# Patient Record
Sex: Male | Born: 2000 | Race: White | Hispanic: No | Marital: Single | Smoking: Current some day smoker
Health system: Southern US, Community
[De-identification: ages and names within clinical notes are randomized; demographics above are authoritative.]

## PROBLEM LIST (undated history)

## (undated) DIAGNOSIS — F909 Attention-deficit hyperactivity disorder, unspecified type: Secondary | ICD-10-CM

## (undated) DIAGNOSIS — J45909 Unspecified asthma, uncomplicated: Secondary | ICD-10-CM

## (undated) HISTORY — DX: Unspecified asthma, uncomplicated: J45.909

## (undated) HISTORY — DX: Attention-deficit hyperactivity disorder, unspecified type: F90.9

---
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
## 2018-07-07 ENCOUNTER — Other Ambulatory Visit: Payer: Self-pay

## 2018-07-07 ENCOUNTER — Ambulatory Visit (HOSPITAL_COMMUNITY)
Admission: EM | Admit: 2018-07-07 | Discharge: 2018-07-07 | Disposition: A | Discharge status: Home / Self Care | Payer: BLUE CROSS/BLUE SHIELD | Attending: Internal Medicine | Admitting: Internal Medicine

## 2018-07-07 ENCOUNTER — Encounter (HOSPITAL_COMMUNITY): Payer: Self-pay

## 2018-07-07 DIAGNOSIS — K2901 Acute gastritis with bleeding: Secondary | ICD-10-CM | POA: Diagnosis not present

## 2018-07-07 MED ORDER — SUCRALFATE 1 G PO TABS: 1.0000 g | ORAL_TABLET | Freq: Three times a day (TID) | ORAL | 0 refills | Status: AC

## 2018-07-07 MED ORDER — SUCRALFATE 1 G PO TABS: 1.0000 g | ORAL_TABLET | Freq: Three times a day (TID) | ORAL | 0 refills | Status: DC

## 2018-07-07 MED ORDER — PANTOPRAZOLE SODIUM 20 MG PO TBEC: 20.0000 mg | DELAYED_RELEASE_TABLET | Freq: Two times a day (BID) | ORAL | 0 refills | Status: AC

## 2018-07-07 NOTE — ED Provider Notes (Signed)
[facility]    CSN: [id number] Arrival date & time: 07/07/18  1858     History   Chief Complaint Chief Complaint  Patient presents with  . Hematemesis    HPI Ronald Osborne is a 18 y.o. male with a history of ADHD comes to urgent care with complaints of 1 day history of mild to moderate epigastric pain with vomiting.  Patient was in his usual state of health when he started having epigastric pain yesterday.  He had one episode of emesis yesterday.  Emesis contained food previously eaten.  He then had another episode of emesis this morning which had some bright red blood.  At that time he had some dizziness but denies passing out.  He started tolerating oral intake throughout the day and has not had any further episodes of vomiting.  Epigastric pain is mild at this time.  He denies any radiation of the pain.  Nausea and vomiting has subsided.  No dizziness, lightheadedness or fainting.  Patient denies any alcohol intake or NSAID use.  He smokes marijuana regularly.   Past Medical History:  Diagnosis Date  . ADHD (attention deficit hyperactivity disorder)   . Asthma    No issues since he was a child    Patient Active Problem List   Diagnosis Date Noted  . ADHD (attention deficit hyperactivity disorder) 08/22/2014  . Wart 08/22/2014  . Anxiety state 08/22/2014    History reviewed. No pertinent surgical history.     Home Medications    Prior to Admission medications   Medication Sig Start Date End Date Taking? Authorizing Provider  amphetamine-dextroamphetamine (ADDERALL XR) 10 MG 24 hr capsule Take 1 capsule (10 mg total) by mouth daily. 04/29/15   Nani Ravens, MD  diphenhydrAMINE (BENADRYL) 25 MG tablet Take 1 tablet (25 mg total) by mouth every 8 (eight) hours as needed for itching. 08/09/17   Fayrene Helper, PA-C  pantoprazole (PROTONIX) 20 MG tablet Take 1 tablet (20 mg total) by mouth 2 (two) times daily for 14 days. 07/07/18 07/21/18  LampteyBritta Mccreedy, MD   sucralfate (CARAFATE) 1 g tablet Take 1 tablet (1 g total) by mouth 4 (four) times daily -  with meals and at bedtime for 14 days. 07/07/18 07/21/18  Merrilee Jansky, MD    Family History Family History  Problem Relation Age of Onset  . Cancer Mother   . Depression Mother   . ADD / ADHD Father   . Depression Father   . ADD / ADHD Brother   . Alcohol abuse Maternal Grandmother   . Cancer Maternal Grandmother   . Diabetes Maternal Grandmother   . Hypertension Maternal Grandmother   . Asthma Maternal Grandfather   . Cancer Maternal Grandfather   . Heart disease Maternal Grandfather   . Diabetes Maternal Grandfather   . Hyperlipidemia Maternal Grandfather   . Hypertension Maternal Grandfather   . Cancer Paternal Grandmother   . Diabetes Paternal Grandmother   . Hypertension Paternal Grandmother   . Cancer Paternal Grandfather   . Heart disease Paternal Grandfather   . Diabetes Paternal Grandfather   . Hyperlipidemia Paternal Grandfather   . Hypertension Paternal Grandfather     Social History Social History   Tobacco Use  . Smoking status: Current Some Day Smoker  . Smokeless tobacco: Never Used  Substance Use Topics  . Alcohol use: No    Alcohol/week: 0.0 standard drinks  . Drug use: Yes    Types: Marijuana  Allergies   Penicillins and Percocet [oxycodone-acetaminophen]   Review of Systems Review of Systems  Constitutional: Negative for activity change, appetite change, fatigue and fever.  HENT: Negative.   Respiratory: Negative for cough, chest tightness, shortness of breath and wheezing.   Cardiovascular: Negative for chest pain.  Gastrointestinal: Positive for abdominal pain, nausea and vomiting. Negative for abdominal distention, anal bleeding, blood in stool, constipation, diarrhea and rectal pain.  Genitourinary: Negative.   Musculoskeletal: Negative for arthralgias, back pain, gait problem and joint swelling.  Skin: Negative for pallor and rash.   Neurological: Negative for dizziness, syncope, light-headedness and headaches.  All other systems reviewed and are negative.    Physical Exam Triage Vital Signs ED Triage Vitals  Enc Vitals Group     BP 07/07/18 1926 133/63     Pulse Rate 07/07/18 1926 81     Resp 07/07/18 1926 17     Temp 07/07/18 1926 98.4 F (36.9 C)     Temp Source 07/07/18 1926 Oral     SpO2 07/07/18 1926 100 %     Weight --      Height --      Head Circumference --      Peak Flow --      Pain Score 07/07/18 1923 0     Pain Loc --      Pain Edu? --      Excl. in GC? --    No data found.  Updated Vital Signs BP 133/63 (BP Location: Right Arm)   Pulse 81   Temp 98.4 F (36.9 C) (Oral)   Resp 17   SpO2 100%   Visual Acuity Right Eye Distance:   Left Eye Distance:   Bilateral Distance:    Right Eye Near:   Left Eye Near:    Bilateral Near:     Physical Exam Constitutional:      General: He is not in acute distress.    Appearance: He is not ill-appearing.  Eyes:     General: No scleral icterus.    Conjunctiva/sclera: Conjunctivae normal.  Neck:     Musculoskeletal: Normal range of motion. No neck rigidity.  Cardiovascular:     Rate and Rhythm: Normal rate and regular rhythm.     Pulses: Normal pulses.     Heart sounds: Normal heart sounds.  Pulmonary:     Effort: Pulmonary effort is normal.     Breath sounds: Normal breath sounds.  Abdominal:     General: Abdomen is flat. Bowel sounds are normal. There is no distension.     Palpations: There is no mass.     Tenderness: There is abdominal tenderness. There is no guarding or rebound.  Musculoskeletal: Normal range of motion.  Skin:    General: Skin is warm.     Capillary Refill: Capillary refill takes less than 2 seconds.  Neurological:     General: No focal deficit present.     Mental Status: He is alert.  Psychiatric:        Mood and Affect: Mood normal.      UC Treatments / Results  Labs (all labs ordered are listed,  but only abnormal results are displayed) Labs Reviewed - No data to display  EKG None  Radiology No results found.  Procedures Procedures (including critical care time)  Medications Ordered in UC Medications - No data to display  Initial Impression / Assessment and Plan / UC Course  I have reviewed the triage vital signs and the  nursing notes.  Pertinent labs & imaging results that were available during my care of the patient were reviewed by me and considered in my medical decision making (see chart for details).     1.  Acute gastritis with hemorrhage: Protonix 40 mg twice daily for 14 days Carafate 1 g before each meal for 14 days Patient's conjunctiva is pink.  He is tolerating oral intake at this time.  He has no symptoms of acute blood loss.  I will defer checking CBC at this time.  Patient has been given strict instructions regarding returning to ED if he has dizziness, recurrence of hematemesis, fainting or near fainting episodes.  Patient verbalized understanding of the plan. Final Clinical Impressions(s) / UC Diagnoses   Final diagnoses:  Acute superficial gastritis with hemorrhage   Discharge Instructions   None    ED Prescriptions    Medication Sig Dispense Auth. Provider   pantoprazole (PROTONIX) 20 MG tablet Take 1 tablet (20 mg total) by mouth 2 (two) times daily for 14 days. 28 tablet Lamptey, Britta Mccreedy, MD   sucralfate (CARAFATE) 1 g tablet  (Status: Discontinued) Take 1 tablet (1 g total) by mouth 4 (four) times daily -  with meals and at bedtime for 14 days. 56 tablet Lamptey, Britta Mccreedy, MD   sucralfate (CARAFATE) 1 g tablet Take 1 tablet (1 g total) by mouth 4 (four) times daily -  with meals and at bedtime for 14 days. 56 tablet Lamptey, Britta Mccreedy, MD     Controlled Substance Prescriptions [place] Controlled Substance Registry consulted? No   Merrilee Jansky, MD 07/07/18 2006

## 2018-07-07 NOTE — ED Notes (Signed)
Patient verbalizes understanding of discharge instructions. Opportunity for questioning and answers were provided. Patient discharged from [facility] by MD. 

## 2018-07-07 NOTE — ED Triage Notes (Signed)
Patient presents to Urgent Care with complaints of hematemesis since yesterday. Patient states he had one episode today with "some" dark blood, no coffee-ground appearance. Pt not able to tolerate food at this time, but is tolerating liquids. Pt admits to smoking marijuana daily.

## 2019-10-16 ENCOUNTER — Ambulatory Visit: Payer: HRSA Program | Attending: Internal Medicine

## 2019-10-16 DIAGNOSIS — Z20822 Contact with and (suspected) exposure to covid-19: Secondary | ICD-10-CM | POA: Diagnosis present

## 2019-10-16 DIAGNOSIS — U071 COVID-19: Secondary | ICD-10-CM | POA: Diagnosis not present

## 2019-10-17 LAB — NOVEL CORONAVIRUS, NAA: SARS-CoV-2, NAA: DETECTED — AB

## 2019-10-17 LAB — SARS-COV-2, NAA 2 DAY TAT
